# Patient Record
Sex: Female | Born: 1955 | Race: Black or African American | Hispanic: No | Marital: Married | Smoking: Current every day smoker
Health system: Southern US, Community
[De-identification: ages and names within clinical notes are randomized; demographics above are authoritative.]

---

## 2007-07-03 ENCOUNTER — Emergency Department (HOSPITAL_COMMUNITY): Admission: EM | Admit: 2007-07-03 | Discharge: 2007-07-03 | Payer: Self-pay | Admitting: Emergency Medicine

## 2020-10-10 ENCOUNTER — Other Ambulatory Visit: Payer: Self-pay

## 2020-10-10 ENCOUNTER — Emergency Department (HOSPITAL_COMMUNITY): Payer: Self-pay

## 2020-10-10 ENCOUNTER — Emergency Department (HOSPITAL_COMMUNITY)
Admission: EM | Admit: 2020-10-10 | Discharge: 2020-10-10 | Disposition: A | Discharge status: Home / Self Care | Payer: Self-pay | Attending: Emergency Medicine | Admitting: Emergency Medicine

## 2020-10-10 ENCOUNTER — Encounter (HOSPITAL_COMMUNITY): Payer: Self-pay | Admitting: Emergency Medicine

## 2020-10-10 DIAGNOSIS — A691 Other Vincent's infections: Secondary | ICD-10-CM | POA: Insufficient documentation

## 2020-10-10 LAB — COMPREHENSIVE METABOLIC PANEL
ALT: 13 U/L (ref 0–44)
AST: 18 U/L (ref 15–41)
Albumin: 3.9 g/dL (ref 3.5–5.0)
Alkaline Phosphatase: 44 U/L (ref 38–126)
Anion gap: 18 — ABNORMAL HIGH (ref 5–15)
BUN: 24 mg/dL — ABNORMAL HIGH (ref 8–23)
CO2: 24 mmol/L (ref 22–32)
Calcium: 9.8 mg/dL (ref 8.9–10.3)
Chloride: 94 mmol/L — ABNORMAL LOW (ref 98–111)
Creatinine, Ser: 1.1 mg/dL — ABNORMAL HIGH (ref 0.44–1.00)
GFR, Estimated: 56 mL/min — ABNORMAL LOW (ref >60)
Glucose, Bld: 112 mg/dL — ABNORMAL HIGH (ref 70–99)
Potassium: 3.4 mmol/L — ABNORMAL LOW (ref 3.5–5.1)
Sodium: 136 mmol/L (ref 135–145)
Total Bilirubin: 0.9 mg/dL (ref 0.3–1.2)
Total Protein: 7.1 g/dL (ref 6.5–8.1)

## 2020-10-10 LAB — CBC WITH DIFFERENTIAL/PLATELET
Abs Immature Granulocytes: 0.04 10*3/uL (ref 0.00–0.07)
Basophils Absolute: 0 10*3/uL (ref 0.0–0.1)
Basophils Relative: 0 %
Eosinophils Absolute: 0 10*3/uL (ref 0.0–0.5)
Eosinophils Relative: 0 %
HCT: 44.7 % (ref 36.0–46.0)
Hemoglobin: 15.3 g/dL — ABNORMAL HIGH (ref 12.0–15.0)
Immature Granulocytes: 0 %
Lymphocytes Relative: 8 %
Lymphs Abs: 0.7 10*3/uL (ref 0.7–4.0)
MCH: 29.4 pg (ref 26.0–34.0)
MCHC: 34.2 g/dL (ref 30.0–36.0)
MCV: 86 fL (ref 80.0–100.0)
Monocytes Absolute: 0.9 10*3/uL (ref 0.1–1.0)
Monocytes Relative: 9 %
Neutro Abs: 7.8 10*3/uL — ABNORMAL HIGH (ref 1.7–7.7)
Neutrophils Relative %: 83 %
Platelets: 333 10*3/uL (ref 150–400)
RBC: 5.2 MIL/uL — ABNORMAL HIGH (ref 3.87–5.11)
RDW: 13.5 % (ref 11.5–15.5)
WBC: 9.4 10*3/uL (ref 4.0–10.5)
nRBC: 0 % (ref 0.0–0.2)

## 2020-10-10 LAB — LACTIC ACID, PLASMA: Lactic Acid, Venous: 1.3 mmol/L (ref 0.5–1.9)

## 2020-10-10 MED ORDER — CHLORHEXIDINE GLUCONATE 0.12 % MT SOLN: 15.0000 mL | Freq: Two times a day (BID) | OROMUCOSAL | 0 refills | Status: AC

## 2020-10-10 MED ORDER — CHLORHEXIDINE GLUCONATE 0.12 % MT SOLN
15.0000 mL | Freq: Once | OROMUCOSAL | Status: AC
  Administered 2020-10-10: 15 mL via OROMUCOSAL
  Filled 2020-10-10: qty 15

## 2020-10-10 MED ORDER — AMOXICILLIN-POT CLAVULANATE 875-125 MG PO TABS
1.0000 | ORAL_TABLET | Freq: Once | ORAL | Status: AC
  Administered 2020-10-10: 1 via ORAL
  Filled 2020-10-10: qty 1

## 2020-10-10 MED ORDER — AMOXICILLIN-POT CLAVULANATE 875-125 MG PO TABS: 1.0000 | ORAL_TABLET | Freq: Two times a day (BID) | ORAL | 0 refills | Status: AC

## 2020-10-10 MED ORDER — THIAMINE HCL 100 MG/ML IJ SOLN
100.0000 mg | Freq: Every day | INTRAMUSCULAR | Status: DC
  Administered 2020-10-10: 100 mg via INTRAVENOUS
  Filled 2020-10-10: qty 2

## 2020-10-10 MED ORDER — SODIUM CHLORIDE 0.9 % IV BOLUS
1000.0000 mL | Freq: Once | INTRAVENOUS | Status: AC
  Administered 2020-10-10: 1000 mL via INTRAVENOUS

## 2020-10-10 MED ORDER — IOHEXOL 350 MG/ML SOLN
100.0000 mL | Freq: Once | INTRAVENOUS | Status: AC | PRN
  Administered 2020-10-10: 100 mL via INTRAVENOUS

## 2020-10-10 NOTE — ED Triage Notes (Signed)
Pt to triage via [facility] from home.  Reports generalized weakness, dizziness, and blurred vision x 4-5 days.  Reports decreased PO intake x 2 days.  Reports declining health x 2 months and family found pt today with AMS.  20g L AC.  NS 500cc.

## 2020-10-10 NOTE — ED Provider Notes (Signed)
[facility] EMERGENCY DEPARTMENT Provider Note   CSN: [id number] Arrival date & time: 10/10/20  1441     History Chief Complaint  Patient presents with   Weakness    Lisa Beasley is a 65 y.o. female.  65 yo F with chief complaints of confusion and her teeth falling out.  The patient states that her mouth has been hurting her little bit since she had a tooth spontaneously fall out a couple days ago.  She feels like this problem with her mouth is just started though traditionally has not really eaten or drank much.  Denies cough congestion or fever denies nausea vomiting or diarrhea denies headaches or neck pain.  She has had some episodes where she feels like she sees spots when she stands up suddenly.  She denies history of immunosuppression has a history of alcoholism but denies recent use.  The history is provided by the patient.  Weakness Severity:  Moderate Onset quality:  Gradual Duration:  4 days Timing:  Constant Progression:  Worsening Chronicity:  New Relieved by:  Nothing Worsened by:  Nothing Ineffective treatments:  None tried Associated symptoms: no arthralgias, no chest pain, no dizziness, no dysuria, no fever, no headaches, no myalgias, no nausea, no shortness of breath, no urgency and no vomiting       History reviewed. No pertinent past medical history.  There are no problems to display for this patient.   History reviewed. No pertinent surgical history.   OB History   No obstetric history on file.     No family history on file.     Home Medications Prior to Admission medications   Medication Sig Start Date End Date Taking? Authorizing Provider  amoxicillin-clavulanate (AUGMENTIN) 875-125 MG tablet Take 1 tablet by mouth 2 (two) times daily. One po bid x 7 days 10/10/20  Yes Melene Plan, DO  chlorhexidine (PERIDEX) 0.12 % solution Use as directed 15 mLs in the mouth or throat 2 (two) times daily. 10/10/20  Yes Melene Plan,  DO    Allergies    Patient has no allergy information on record.  Review of Systems   Review of Systems  Constitutional:  Negative for chills and fever.  HENT:  Negative for congestion and rhinorrhea.   Eyes:  Negative for redness and visual disturbance.  Respiratory:  Negative for shortness of breath and wheezing.   Cardiovascular:  Negative for chest pain and palpitations.  Gastrointestinal:  Negative for nausea and vomiting.  Genitourinary:  Negative for dysuria and urgency.  Musculoskeletal:  Negative for arthralgias and myalgias.  Skin:  Negative for pallor and wound.  Neurological:  Positive for weakness. Negative for dizziness and headaches.   Physical Exam Updated Vital Signs BP (!) 132/97   Pulse (!) 56   Temp 98 F (36.7 C) (Oral)   Resp 18   SpO2 100%   Physical Exam Vitals and nursing note reviewed.  Constitutional:      General: She is not in acute distress.    Appearance: She is well-developed. She is not diaphoretic.  HENT:     Head: Normocephalic and atraumatic.  Eyes:     Pupils: Pupils are equal, round, and reactive to light.  Cardiovascular:     Rate and Rhythm: Normal rate and regular rhythm.     Heart sounds: No murmur heard.   No friction rub. No gallop.  Pulmonary:     Effort: Pulmonary effort is normal.     Breath sounds: No  wheezing or rales.  Abdominal:     General: There is no distension.     Palpations: Abdomen is soft.     Tenderness: There is no abdominal tenderness.  Musculoskeletal:        General: No tenderness.     Cervical back: Normal range of motion and neck supple.  Skin:    General: Skin is warm and dry.  Neurological:     Mental Status: She is alert and oriented to person, place, and time.  Psychiatric:        Behavior: Behavior normal.    ED Results / Procedures / Treatments   Labs (all labs ordered are listed, but only abnormal results are displayed) Labs Reviewed  CBC WITH DIFFERENTIAL/PLATELET - Abnormal;  Notable for the following components:      Result Value   RBC 5.20 (*)    Hemoglobin 15.3 (*)    Neutro Abs 7.8 (*)    All other components within normal limits  COMPREHENSIVE METABOLIC PANEL - Abnormal; Notable for the following components:   Potassium 3.4 (*)    Chloride 94 (*)    Glucose, Bld 112 (*)    BUN 24 (*)    Creatinine, Ser 1.10 (*)    GFR, Estimated 56 (*)    Anion gap 18 (*)    All other components within normal limits  LACTIC ACID, PLASMA  URINALYSIS, ROUTINE W REFLEX MICROSCOPIC  RAPID URINE DRUG SCREEN, HOSP PERFORMED    EKG EKG Interpretation  Date/Time:  Saturday October 10 2020 14:42:10 EDT Ventricular Rate:  94 PR Interval:  112 QRS Duration: 62 QT Interval:  368 QTC Calculation: 460 R Axis:   -88 Text Interpretation: Normal sinus rhythm Left axis deviation Inferior infarct , age undetermined Cannot rule out Anterior infarct , age undetermined Abnormal ECG No old tracing to compare Confirmed by Melene Plan [phone]) on 10/10/2020 3:38:05 PM  Radiology CT Angio Head W or Wo Contrast  Result Date: 10/10/2020 CLINICAL DATA:  Carotid artery aneurysm. Additional provided: Patient reports generalized weakness, dizziness, blurred vision for 4-5 days, decreased p.o. intake. EXAM: CT ANGIOGRAPHY HEAD AND NECK TECHNIQUE: Multidetector CT imaging of the head and neck was performed using the standard protocol during bolus administration of intravenous contrast. Multiplanar CT image reconstructions and MIPs were obtained to evaluate the vascular anatomy. Carotid stenosis measurements (when applicable) are obtained utilizing NASCET criteria, using the distal internal carotid diameter as the denominator. CONTRAST:  OMNIPAQUE IOHEXOL 350 MG/ML SOLN COMPARISON:  Noncontrast head CT performed earlier today 10/10/2020. FINDINGS: CTA NECK FINDINGS Aortic arch: Standard aortic branching. Mild atherosclerotic plaque within the visualized aortic arch and proximal major branch vessels  of the neck. No hemodynamically significant innominate or proximal subclavian artery stenosis. Right carotid system: CCA and ICA patent within the neck without stenosis. Minimal soft and calcified plaque within the carotid bifurcation and proximal ICA. Left carotid system: CCA and ICA patent within the neck without stenosis. Minimal soft and calcified plaque within the carotid bifurcation and proximal ICA. Vertebral arteries: Patent within the neck without appreciable stenosis. Right vertebral artery dominant. Skeleton: Cervical spondylosis. No acute bony abnormality or aggressive osseous lesion. Other neck: No neck mass or cervical lymphadenopathy. Upper chest: No consolidation within the imaged lung apices. Review of the MIP images confirms the above findings CTA HEAD FINDINGS Anterior circulation: The intracranial internal carotid arteries are patent. The M1 middle cerebral arteries are patent. No M2 proximal branch occlusion or high-grade proximal stenosis is identified. The  anterior cerebral arteries are patent. 2-3 mm inferolaterally projecting vascular protrusion arising from the paraclinoid right ICA, which may reflect an aneurysm or infundibulum (series 7, image 113). Posterior circulation: The intracranial vertebral arteries are patent. The basilar artery is patent. The posterior cerebral arteries are patent. A right posterior communicating artery is present. The left posterior communicating artery is hypoplastic or absent. 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 Venous sinuses: Poorly assessed due to contrast timing. Anatomic variants: As described Review of the MIP images confirms the above findings IMPRESSION: CTA neck: The common carotid, internal carotid and vertebral arteries are patent within the neck without stenosis. Mild atherosclerotic plaque within the visualized aortic arch, proximal major branch vessels of the neck and bilateral carotid systems within the neck,  as described. Aortic Atherosclerosis (ICD10-I70.0). CTA head: 1. 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 Neuro-interventional consultation is recommended. 2. 2-3 mm vascular protrusion arising from the paraclinoid right internal carotid artery, which may reflect an aneurysm or infundibulum. 3. No intracranial large vessel occlusion or proximal high-grade arterial stenosis. Electronically Signed   By: Jackey Loge DO   On: 10/10/2020 18:27   CT Head Wo Contrast  Result Date: 10/10/2020 CLINICAL DATA:  Blurred vision, altered mental status. EXAM: CT HEAD WITHOUT CONTRAST TECHNIQUE: Contiguous axial images were obtained from the base of the skull through the vertex without intravenous contrast. COMPARISON:  No pertinent prior exams available for comparison. FINDINGS: Brain: Mild generalized cerebral and cerebellar atrophy. There is no acute intracranial hemorrhage. No demarcated cortical infarct. No extra-axial fluid collection. No evidence of an intracranial mass. No midline shift. Vascular: Bulbous appearance of the tip of the basilar artery suspicious for basilar tip aneurysm. Additionally, there is increased density within the basilar tip. Skull: Normal. Negative for fracture or focal lesion. Sinuses/Orbits: Visualized orbits show no acute finding. Mild partial opacification of the right ethmoid air cells. These results were called by telephone at the time of interpretation on 10/10/2020 at 4:03 pm to provider Dr. Adela Lank, who verbally acknowledged these results. IMPRESSION: Bulbous appearance of the tip of the basilar artery suspicious for basilar tip aneurysm. Additionally, there is increased density within the basilar tip, and endoluminal thrombus at this site cannot be excluded. Consider CT angiography of the head for further evaluation. No evidence of acute intracranial hemorrhage or acute infarct. Mild generalized parenchymal atrophy. Electronically Signed   By: Jackey Loge  DO   On: 10/10/2020 16:04   CT Angio Neck W and/or Wo Contrast  Result Date: 10/10/2020 CLINICAL DATA:  Carotid artery aneurysm. Additional provided: Patient reports generalized weakness, dizziness, blurred vision for 4-5 days, decreased p.o. intake. EXAM: CT ANGIOGRAPHY HEAD AND NECK TECHNIQUE: Multidetector CT imaging of the head and neck was performed using the standard protocol during bolus administration of intravenous contrast. Multiplanar CT image reconstructions and MIPs were obtained to evaluate the vascular anatomy. Carotid stenosis measurements (when applicable) are obtained utilizing NASCET criteria, using the distal internal carotid diameter as the denominator. CONTRAST:  OMNIPAQUE IOHEXOL 350 MG/ML SOLN COMPARISON:  Noncontrast head CT performed earlier today 10/10/2020. FINDINGS: CTA NECK FINDINGS Aortic arch: Standard aortic branching. Mild atherosclerotic plaque within the visualized aortic arch and proximal major branch vessels of the neck. No hemodynamically significant innominate or proximal subclavian artery stenosis. Right carotid system: CCA and ICA patent within the neck without stenosis. Minimal soft and calcified plaque within the carotid bifurcation and  proximal ICA. Left carotid system: CCA and ICA patent within the neck without stenosis. Minimal soft and calcified plaque within the carotid bifurcation and proximal ICA. Vertebral arteries: Patent within the neck without appreciable stenosis. Right vertebral artery dominant. Skeleton: Cervical spondylosis. No acute bony abnormality or aggressive osseous lesion. Other neck: No neck mass or cervical lymphadenopathy. Upper chest: No consolidation within the imaged lung apices. Review of the MIP images confirms the above findings CTA HEAD FINDINGS Anterior circulation: The intracranial internal carotid arteries are patent. The M1 middle cerebral arteries are patent. No M2 proximal branch occlusion or high-grade proximal stenosis is  identified. The anterior cerebral arteries are patent. 2-3 mm inferolaterally projecting vascular protrusion arising from the paraclinoid right ICA, which may reflect an aneurysm or infundibulum (series 7, image 113). Posterior circulation: The intracranial vertebral arteries are patent. The basilar artery is patent. The posterior cerebral arteries are patent. A right posterior communicating artery is present. The left posterior communicating artery is hypoplastic or absent. 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 Venous sinuses: Poorly assessed due to contrast timing. Anatomic variants: As described Review of the MIP images confirms the above findings IMPRESSION: CTA neck: The common carotid, internal carotid and vertebral arteries are patent within the neck without stenosis. Mild atherosclerotic plaque within the visualized aortic arch, proximal major branch vessels of the neck and bilateral carotid systems within the neck, as described. Aortic Atherosclerosis (ICD10-I70.0). CTA head: 1. 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 Neuro-interventional consultation is recommended. 2. 2-3 mm vascular protrusion arising from the paraclinoid right internal carotid artery, which may reflect an aneurysm or infundibulum. 3. No intracranial large vessel occlusion or proximal high-grade arterial stenosis. Electronically Signed   By: Jackey LogeKyle  Golden DO   On: 10/10/2020 18:27    Procedures Procedures   Medications Ordered in ED Medications  thiamine (B-1) injection 100 mg (100 mg Intravenous Given 10/10/20 1604)  sodium chloride 0.9 % bolus 1,000 mL (0 mLs Intravenous Stopped 10/10/20 1957)  amoxicillin-clavulanate (AUGMENTIN) 875-125 MG per tablet 1 tablet (1 tablet Oral Given 10/10/20 1615)  chlorhexidine (PERIDEX) 0.12 % solution 15 mL (15 mLs Mouth/Throat Given 10/10/20 1616)  iohexol (OMNIPAQUE) 350 MG/ML injection 100 mL (100 mLs Intravenous Contrast Given  10/10/20 1800)    ED Course  I have reviewed the triage vital signs and the nursing notes.  Pertinent labs & imaging results that were available during my care of the patient were reviewed by me and considered in my medical decision making (see chart for details).    MDM Rules/Calculators/A&P                           65  yo F with a chief complaints of confusion and her teeth falling out suddenly.  The patient looks chronically ill.  She has very poor dentition with very small filling breath and a film across her tongue and to her teeth.  I suspect that she has necrotizing gingivitis.  Will start on oral antibiotics.  Bolus of IV fluids.  Awaiting blood work.  Patient CT of the head had an abnormal finding at the basal ganglia.  Radiology recommending CT angiogram.  I did discuss this with the radiologist.  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  I discussed this with Dr. Conchita ParisNundkumar, as there is no signs of bleeding  then he recommended following the patient up in the office.  I will treat the patient for anug.  Dentistry follow up.   8:03 PM:  I have discussed the diagnosis/risks/treatment options with the patient and family and believe the pt to be eligible for discharge home to follow-up with Dentistry, neurosurgery. We also discussed returning to the ED immediately if new or worsening sx occur. We discussed the sx which are most concerning (e.g., sudden worsening pain, fever, inability to tolerate by mouth, headache, confusion, stroke s/sx) that necessitate immediate return. Medications administered to the patient during their visit and any new prescriptions provided to the patient are listed below.  Medications given during this visit Medications  thiamine (B-1) injection 100 mg (100 mg Intravenous Given 10/10/20 1604)  sodium chloride 0.9 % bolus 1,000 mL (0 mLs Intravenous Stopped 10/10/20 1957)  amoxicillin-clavulanate (AUGMENTIN) 875-125 MG per  tablet 1 tablet (1 tablet Oral Given 10/10/20 1615)  chlorhexidine (PERIDEX) 0.12 % solution 15 mL (15 mLs Mouth/Throat Given 10/10/20 1616)  iohexol (OMNIPAQUE) 350 MG/ML injection 100 mL (100 mLs Intravenous Contrast Given 10/10/20 1800)     The patient appears reasonably screen and/or stabilized for discharge and I doubt any other medical condition or other [facility] requiring further screening, evaluation, or treatment in the ED at this time prior to discharge.   Final Clinical Impression(s) / ED Diagnoses Final diagnoses:  Acute necrotizing ulcerative gingivitis    Rx / DC Orders ED Discharge Orders          Ordered    amoxicillin-clavulanate (AUGMENTIN) 875-125 MG tablet  2 times daily        10/10/20 1924    chlorhexidine (PERIDEX) 0.12 % solution  2 times daily        10/10/20 1924             Melene Plan, DO 10/10/20 2003

## 2020-10-10 NOTE — ED Provider Notes (Signed)
Emergency Medicine Provider Triage Evaluation Note  Lisa Beasley , a 65 y.o. female  was evaluated in triage.  Patient family called EMS due to dizziness, weakness and changes in vision.  Patient lives alone and was last seen well 4 days ago.They became worried when she did not answer the phone this morning and went to check on her. Has not seen a doctor in many years and there is a concern for a decline in mental health.    Patient reports tooth pain and feeling poorly for 4 days.   Review of Systems  Positive: Mouth pain, blurred vision, dizziness Negative: CP, SOB, fevers  Physical Exam  BP (!) 124/97   Pulse 99   Temp 98.2 F (36.8 C)   Resp 16   SpO2 99%  Gen:   Awake, no distress   Mouth:  Teeth in poor dentition. Loose  front lower teeth Resp:  Normal effort  MSK:   Moves extremities without difficulty  Neuro:  No cranial nerve deficits. 5/5 strength bilaterally   Medical Decision Making  Medically screening exam initiated at 2:50 PM.  Appropriate orders placed.  Lisa Beasley was informed that the remainder of the evaluation will be completed by another provider, this initial triage assessment does not replace that evaluation, and the importance of remaining in the ED until their evaluation is complete.     Woodroe Chen 10/10/20 1450    Terald Sleeper, MD 10/11/20 [phone]

## 2020-10-10 NOTE — ED Notes (Signed)
Patient transported to CT 

## 2020-10-10 NOTE — ED Notes (Signed)
1st Lactic Acid normal, 2nd to be discontinued.

## 2020-10-10 NOTE — Discharge Instructions (Addendum)
I think likely your symptoms are caused by your infection in your mouth.  You not being able to eat and drink well and is cause you to become dehydrated and malnourished.  Please try and eat and drink as well as you can.  I prescribed you antibiotics to try and help you with the infection in your mouth.  You do need to follow-up with a dentist in the office.  You also had an aneurysm that was seen on CT scan.  This needs to be followed up by a neurosurgeon in the office.  Please give them a call on Monday to schedule an appointment.  Please return to the Emergency Department for worsening symptoms especially headache confusion difficulty swallowing one-sided numbness or weakness.

## 2020-10-27 ENCOUNTER — Other Ambulatory Visit: Payer: Self-pay | Admitting: Neurosurgery

## 2020-10-27 DIAGNOSIS — I671 Cerebral aneurysm, nonruptured: Secondary | ICD-10-CM

## 2020-11-23 ENCOUNTER — Other Ambulatory Visit (HOSPITAL_COMMUNITY): Payer: Self-pay | Admitting: Physician Assistant

## 2020-11-23 ENCOUNTER — Other Ambulatory Visit: Payer: Self-pay | Admitting: Internal Medicine

## 2020-11-24 ENCOUNTER — Encounter (HOSPITAL_COMMUNITY): Payer: Self-pay

## 2020-11-24 ENCOUNTER — Ambulatory Visit (HOSPITAL_COMMUNITY)
Admission: RE | Admit: 2020-11-24 | Discharge: 2020-11-24 | Disposition: A | Discharge status: Home / Self Care | Payer: Medicaid Other | Source: Ambulatory Visit | Attending: Neurosurgery | Admitting: Neurosurgery

## 2020-11-24 ENCOUNTER — Other Ambulatory Visit: Payer: Self-pay

## 2020-11-24 ENCOUNTER — Other Ambulatory Visit: Payer: Self-pay | Admitting: Neurosurgery

## 2020-11-24 DIAGNOSIS — F17210 Nicotine dependence, cigarettes, uncomplicated: Secondary | ICD-10-CM | POA: Insufficient documentation

## 2020-11-24 DIAGNOSIS — I671 Cerebral aneurysm, nonruptured: Secondary | ICD-10-CM | POA: Insufficient documentation

## 2020-11-24 HISTORY — PX: IR ANGIO INTRA EXTRACRAN SEL INTERNAL CAROTID BILAT MOD SED: IMG5363

## 2020-11-24 HISTORY — PX: IR ANGIO VERTEBRAL SEL VERTEBRAL BILAT MOD SED: IMG5369

## 2020-11-24 LAB — CBC WITH DIFFERENTIAL/PLATELET
Abs Immature Granulocytes: 0.02 10*3/uL (ref 0.00–0.07)
Basophils Absolute: 0 10*3/uL (ref 0.0–0.1)
Basophils Relative: 1 %
Eosinophils Absolute: 0.2 10*3/uL (ref 0.0–0.5)
Eosinophils Relative: 4 %
HCT: 39.7 % (ref 36.0–46.0)
Hemoglobin: 12.5 g/dL (ref 12.0–15.0)
Immature Granulocytes: 0 %
Lymphocytes Relative: 26 %
Lymphs Abs: 1.2 10*3/uL (ref 0.7–4.0)
MCH: 28.2 pg (ref 26.0–34.0)
MCHC: 31.5 g/dL (ref 30.0–36.0)
MCV: 89.4 fL (ref 80.0–100.0)
Monocytes Absolute: 0.5 10*3/uL (ref 0.1–1.0)
Monocytes Relative: 11 %
Neutro Abs: 2.6 10*3/uL (ref 1.7–7.7)
Neutrophils Relative %: 58 %
Platelets: 293 10*3/uL (ref 150–400)
RBC: 4.44 MIL/uL (ref 3.87–5.11)
RDW: 14.4 % (ref 11.5–15.5)
WBC: 4.5 10*3/uL (ref 4.0–10.5)
nRBC: 0 % (ref 0.0–0.2)

## 2020-11-24 LAB — PROTIME-INR
INR: 0.9 (ref 0.8–1.2)
Prothrombin Time: 12.4 seconds (ref 11.4–15.2)

## 2020-11-24 LAB — BASIC METABOLIC PANEL
Anion gap: 10 (ref 5–15)
BUN: 13 mg/dL (ref 8–23)
CO2: 24 mmol/L (ref 22–32)
Calcium: 9.6 mg/dL (ref 8.9–10.3)
Chloride: 104 mmol/L (ref 98–111)
Creatinine, Ser: 0.67 mg/dL (ref 0.44–1.00)
GFR, Estimated: >60 mL/min (ref >60)
Glucose, Bld: 79 mg/dL (ref 70–99)
Potassium: 4.9 mmol/L (ref 3.5–5.1)
Sodium: 138 mmol/L (ref 135–145)

## 2020-11-24 MED ORDER — FENTANYL CITRATE (PF) 100 MCG/2ML IJ SOLN
INTRAMUSCULAR | Status: DC | PRN
  Administered 2020-11-24: 25 ug via INTRAVENOUS

## 2020-11-24 MED ORDER — LIDOCAINE HCL (PF) 1 % IJ SOLN
INTRAMUSCULAR | Status: DC | PRN
  Administered 2020-11-24: 10 mL

## 2020-11-24 MED ORDER — IOHEXOL 240 MG/ML SOLN
50.0000 mL | Freq: Once | INTRAMUSCULAR | Status: AC | PRN
  Administered 2020-11-24: 40 mL via INTRA_ARTERIAL

## 2020-11-24 MED ORDER — FENTANYL CITRATE (PF) 100 MCG/2ML IJ SOLN
INTRAMUSCULAR | Status: AC
  Filled 2020-11-24: qty 2

## 2020-11-24 MED ORDER — HEPARIN SODIUM (PORCINE) 1000 UNIT/ML IJ SOLN
INTRAMUSCULAR | Status: AC
  Filled 2020-11-24: qty 1

## 2020-11-24 MED ORDER — MIDAZOLAM HCL 2 MG/2ML IJ SOLN
INTRAMUSCULAR | Status: AC
  Filled 2020-11-24: qty 2

## 2020-11-24 MED ORDER — HEPARIN SODIUM (PORCINE) 1000 UNIT/ML IJ SOLN
INTRAMUSCULAR | Status: DC | PRN
  Administered 2020-11-24: 2000 [IU] via INTRAVENOUS

## 2020-11-24 MED ORDER — HYDROCODONE-ACETAMINOPHEN 5-325 MG PO TABS: 1.0000 | ORAL_TABLET | ORAL | Status: DC | PRN

## 2020-11-24 MED ORDER — IOHEXOL 240 MG/ML SOLN
50.0000 mL | Freq: Once | INTRAMUSCULAR | Status: AC | PRN
  Administered 2020-11-24: 10 mL via INTRA_ARTERIAL

## 2020-11-24 MED ORDER — SODIUM CHLORIDE 0.9 % IV SOLN: INTRAVENOUS | Status: DC

## 2020-11-24 MED ORDER — MIDAZOLAM HCL 2 MG/2ML IJ SOLN
INTRAMUSCULAR | Status: DC | PRN
  Administered 2020-11-24: 1 mg via INTRAVENOUS

## 2020-11-24 MED ORDER — LIDOCAINE HCL 1 % IJ SOLN
INTRAMUSCULAR | Status: AC
  Filled 2020-11-24: qty 20

## 2020-11-24 NOTE — Brief Op Note (Signed)
  NEUROSURGERY BRIEF OPERATIVE  NOTE   PREOP DX: Cerebral aneurysms  POSTOP DX: Same  PROCEDURE: Diagnostic cerebral angiogram  SURGEON: Dr. 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

## 2020-11-24 NOTE — Progress Notes (Signed)
Patients necklace and earrings removed and placed in a sealed bag which was then placed inside her patient belonging bag.

## 2020-11-24 NOTE — H&P (Signed)
  Chief Complaint  Aneurysm  History of Present Illness  Lisa Beasley is a 65 y.o. female whose history begins initially with a visit to the emergency department after an episode of dehydration.  She presented with altered mental status where work-up included CT scan of the head demonstrating possible basilar apex aneurysm.  This was further confirmed with a CT angiogram.  As an incidental finding, the patient was discharged and was seen in the outpatient neurosurgery clinic.  Given the patient's young age, further work-up with likely intention to treat the aneurysm was recommended.  The patient therefore presents today for diagnostic cerebral angiogram.  Past Medical History  History reviewed. No pertinent past medical history.  Past Surgical History  History reviewed. No pertinent surgical history.  Social History   Social History   Tobacco Use   Smoking status: Every Day    Types: Cigarettes   Smokeless tobacco: Never  Vaping Use   Vaping Use: Never used  Substance Use Topics   Alcohol use: Not Currently   Drug use: Never    Medications   Prior to Admission medications   Medication Sig Start Date End Date Taking? Authorizing Provider  amoxicillin-clavulanate (AUGMENTIN) 875-125 MG tablet Take 1 tablet by mouth 2 (two) times daily. One po bid x 7 days 10/10/20   Melene Plan, DO  chlorhexidine (PERIDEX) 0.12 % solution Use as directed 15 mLs in the mouth or throat 2 (two) times daily. 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  Impression  - 65 y.o. 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  We have discussed the expected postoperative course and recovery.  Associated risks, benefits, and alternatives to the procedure were also reviewed in detail.  After all her questions were answered informed consent was obtained and witnessed.  Lisbeth Renshaw, MD [facility]

## 2020-11-24 NOTE — Sedation Documentation (Signed)
Sheath pulled at 0923, pressure released at 0936- site level 0. Site is dressed with a quick clot, gauze, and tegederm.

## 2020-11-24 NOTE — Sedation Documentation (Signed)
Bedside handoff with Maureen Ralphs, RN in short stay. Patient's groin site level 0, pulses palpable. Patient needing reminders to keep right leg straight. Nothing further needed at this time.

## 2022-04-06 IMAGING — XA IR ANGIO VETEBRAL SEL VERTEBRAL BILAT MOD SED
8 of 9 series · 12 of 24 positions shown · IV contrast (IODINE)
Comparison: none

PROCEDURE:
DIAGNOSTIC CEREBRAL ANGIOGRAM
HISTORY: The patient is a 65-year-old woman previously seen in the emergency
department for altered mental status likely due to dehydration.
Workup included CT followed by CT angiogram incidentally discovering
basilar apex aneurysm and possible small right posterior
communicating artery aneurysm. Patient was seen in the Outpatient
neurosurgery clinic and presents today for further workup with
diagnostic cerebral angiogram.
TECHNIQUE: CATHETERS AND WIRES
5-French JB-1 catheter

[Series 1: cerebral care 2 · 2 acquisitions, 1 frame shown (1 of 7)]
[im 1/2]
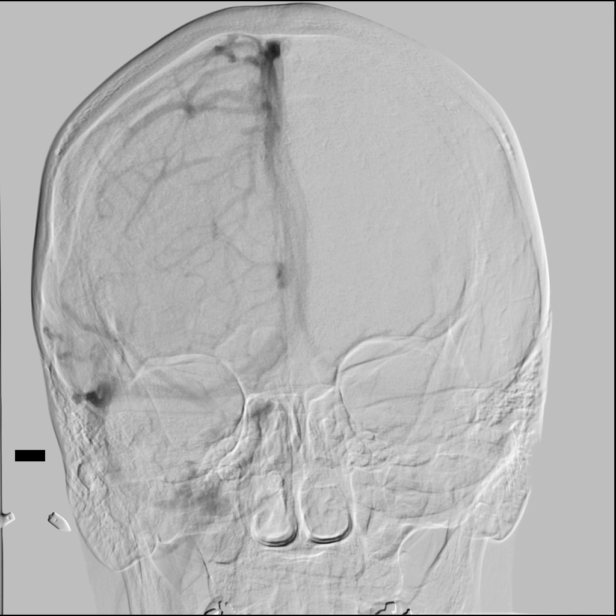

[Series 3: cerebral care 2 · 2 acquisitions, 1 frame shown (2 of 7)]
[im 1/2]
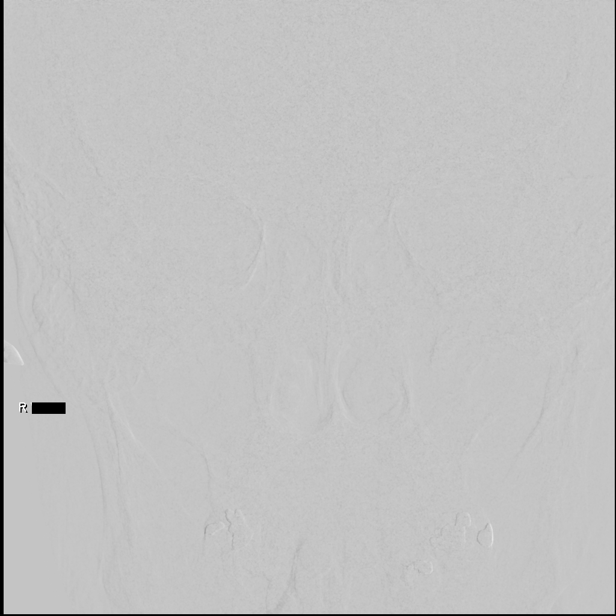

[Series 4: cerebral care 2 · 2 acquisitions, 1 frame shown (3 of 7)]
[im 1/2]
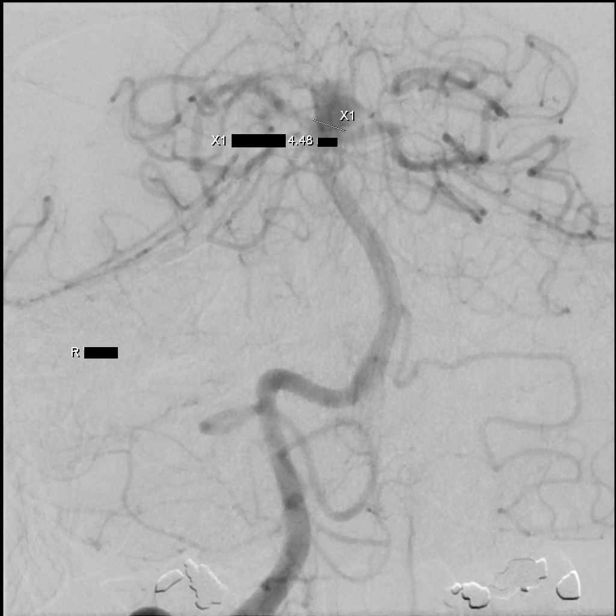

[Series 5: cerebral care 2 · 2 acquisitions, 2 frames shown (4 of 7)]
[im 1/2]
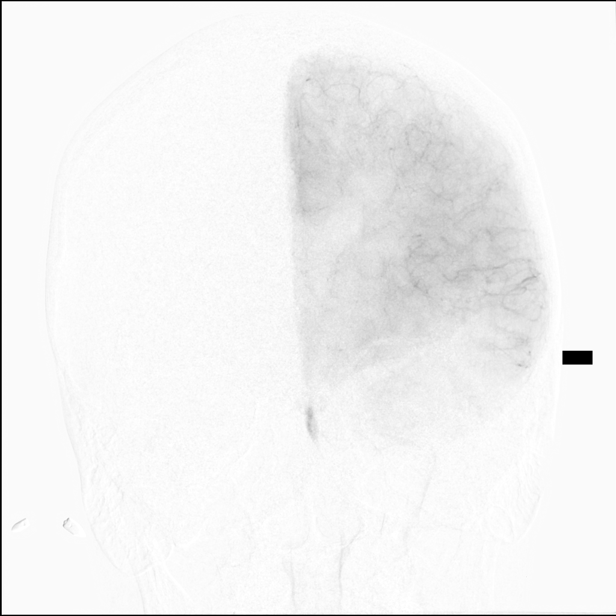
[im 2/2]
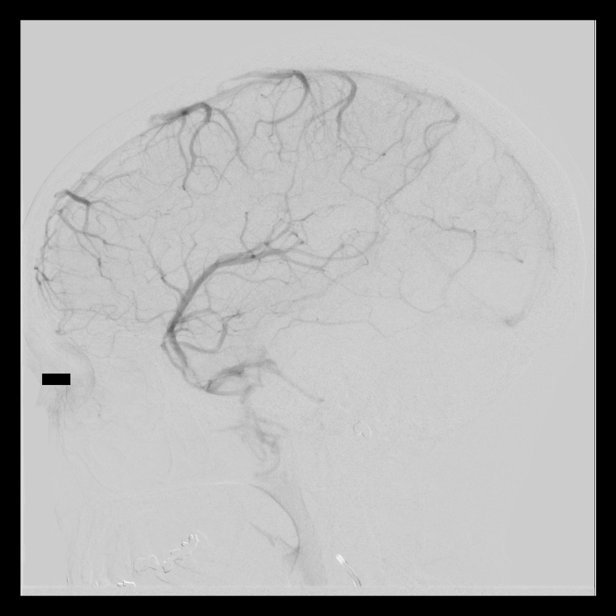

[Series 6: cerebral care 2 · 2 acquisitions, 1 frame shown (5 of 7)]
[im 2/2]
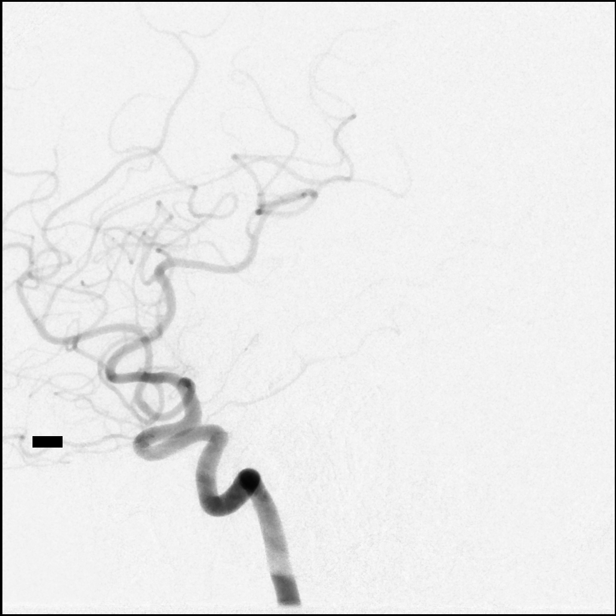

[Series 7: cerebral care 2 · 2 acquisitions, 2 frames shown (6 of 7)]
[im 1/2]
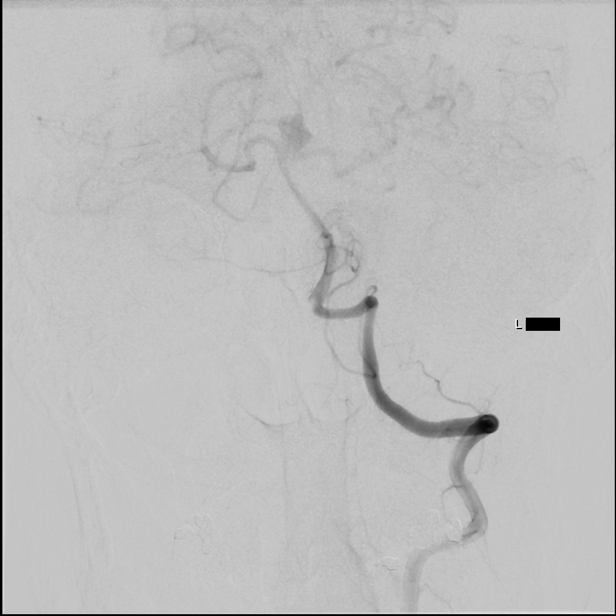
[im 2/2]
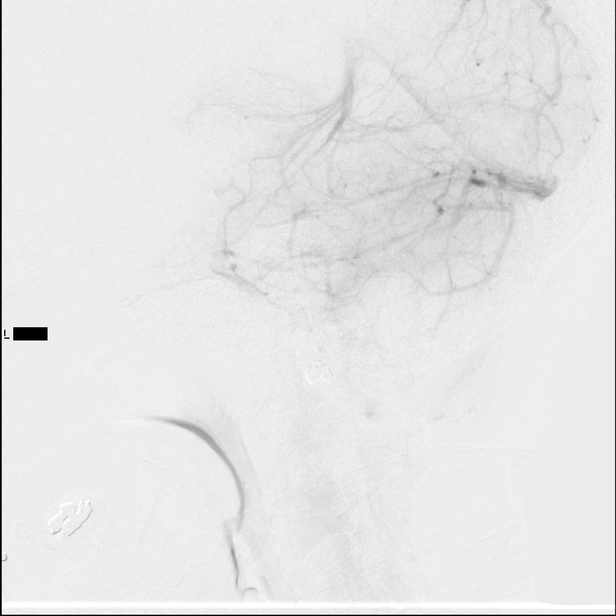

[Series 8: cerebral care 2 · 2 acquisitions, 1 frame shown (7 of 7)]
[im 2/2]
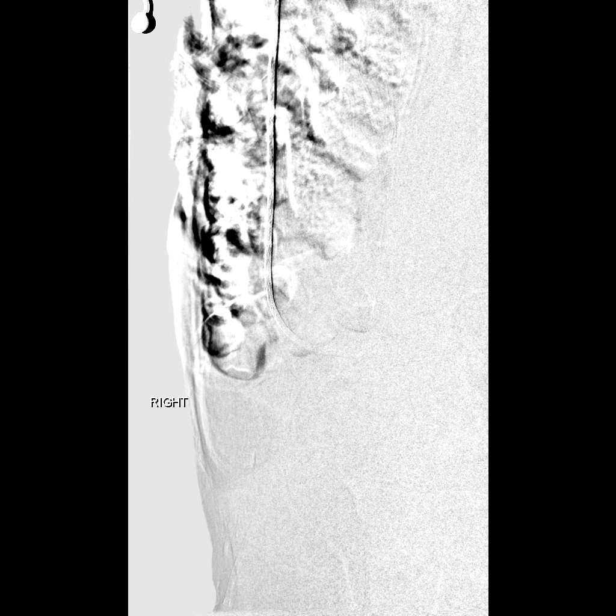

[Series 300: dr. (person_name) · 3 of 16 slices shown]
[im 2/16]
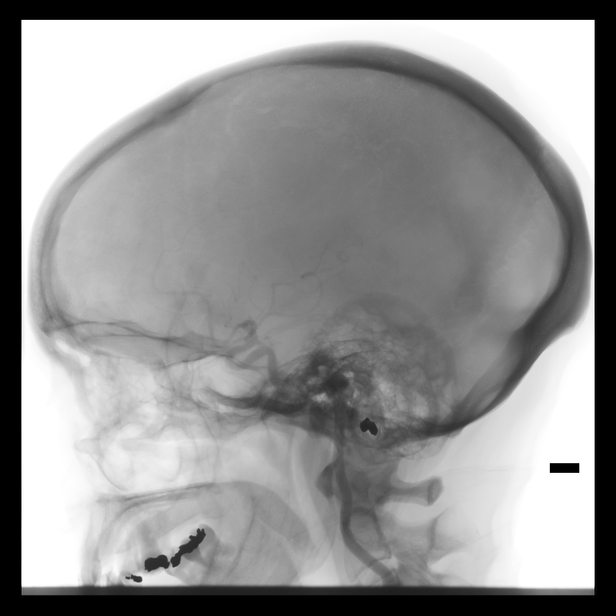
[im 9/16]
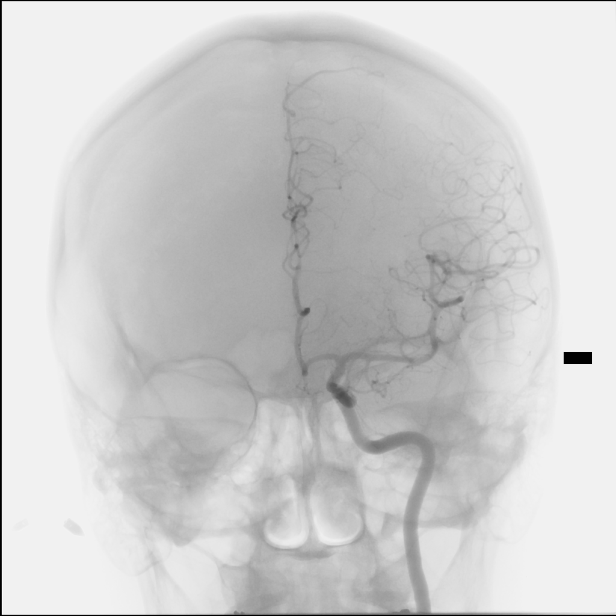
[im 16/16]
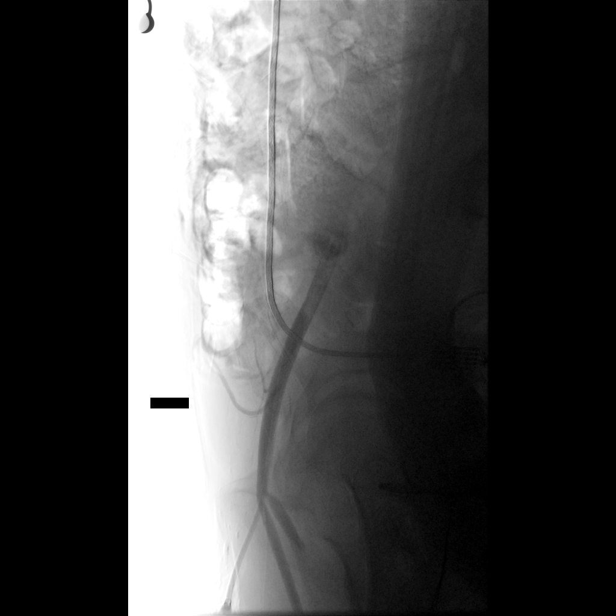

[12 of 24 positions shown; findings below may reference images not displayed]

ACCESS:
The technical aspects of the procedure as well as its potential
risks and benefits were reviewed with the patient. These risks
included but were not limited bleeding, infection, allergic
reaction, damage to organs or vital structures, stroke,
non-diagnostic procedure, and the catastrophic outcomes of heart
attack, coma, and death. With an understanding of these risks,
informed consent was obtained and witnessed. The patient was placed
in the supine position on the angiography table and the skin of
right groin prepped in the usual sterile fashion.

The procedure was performed under local anesthesia (1%-solution of
bicarbonate-buffered Lidocaine) and conscious sedation with 1mg
versed and 41micrograms fentanyl monitored by myself and the
in-suite nurse using continuous pulse-oximetry, heart rate, and
non-invasive blood-pressure.

A 5- French sheath was introduced in the right common femoral artery
using Seldinger technique. A fluoro-phase sequence was used to
document the sheath position.

MEDICATIONS:
HEPARIN: 0777 Units total.

CONTRAST:  cc, Omnipaque 300

FLUOROSCOPY TIME:  FLUOROSCOPY TIME: See IR records
0.035" glidewire

VESSELS CATHETERIZED
Right internal carotid

Left internal carotid

Left vertebral

Right vertebral

Right common femoral

VESSELS STUDIED
Right internal carotid, head

Left internal carotid, head

Left vertebral

Right vertebral

Right femoral

PROCEDURAL NARRATIVE
A 5-Fr JB-1 catheter was advanced over a 0.035 glidewire into the
aortic arch. The above vessels were then sequentially catheterized
and cervical / cerebral angiograms taken. After review of images,
the catheter was removed without incident.
FINDINGS: Right internal carotid, head:

Injection reveals the presence of a widely patent ICA, M1, and A1
segments and their branches. There is a small aneurysm arising from
the origin of the right posterior communicating artery measuring
approximately 3 x 2.4 mm. This aneurysm projects laterally and
posteriorly. The parenchymal and venous phases are normal. The
venous sinuses are widely patent.

Left internal carotid, head:

Injection reveals the presence of a widely patent ICA, A1, and M1
segments and their branches. No aneurysms, AVMs, or high-flow
fistulas are seen. The parenchymal and venous phases are normal. The
venous sinuses are widely patent.

Right vertebral:

Injection reveals the presence of a widely patent vertebral artery.
This leads to a widely patent basilar artery that terminates in
bilateral P1. There is a relatively wide neck, bilobed aneurysm
arising from the basilar apex. This aneurysm measures approximately
8 mm tall, 5.8 mm AP, and with a neck of approximately 5.5 mm.
Bilateral P1 segments arise at the base of this aneurysm. The
parenchymal and venous phases are normal. The venous sinuses are
widely patent.

Left vertebral:

The vertebral artery is widely patent. No PICA aneurysm is seen. See
basilar description above.

Right femoral:

Normal vessel. No significant atherosclerotic disease. Arterial
sheath in adequate position.

DISPOSITION:
Upon completion of the study, the femoral sheath was removed and
hemostasis obtained by manual compression. Good proximal and distal
lower extremity pulses were documented upon achievement of
hemostasis. The procedure was well tolerated and no early
complications were observed. The patient was transferred to the
holding area to lay flat for 2 hours.
IMPRESSION: 1.  9 mm wide necked basilar apex aneurysm as described above

2.  Approximately 3 mm right posterior communicating artery aneurysm

3. No other intracranial aneurysms, arteriovenous malformations or
high-flow fistulas are seen.

The preliminary results of this procedure were shared with the
patient and the patient's family.
